# Patient Record
Sex: Male | Born: 1970 | Race: Black or African American | Hispanic: No | Smoking: Current every day smoker
Health system: Southern US, Community
[De-identification: ages and names within clinical notes are randomized; demographics above are authoritative.]

## PROBLEM LIST (undated history)

## (undated) HISTORY — PX: APPENDECTOMY: SHX54

---

## 1997-10-20 ENCOUNTER — Emergency Department (HOSPITAL_COMMUNITY): Admission: EM | Admit: 1997-10-20 | Discharge: 1997-10-20 | Payer: Self-pay | Admitting: Emergency Medicine

## 1997-10-22 ENCOUNTER — Emergency Department (HOSPITAL_COMMUNITY): Admission: EM | Admit: 1997-10-22 | Discharge: 1997-10-22 | Payer: Self-pay | Admitting: Emergency Medicine

## 1998-02-08 ENCOUNTER — Emergency Department (HOSPITAL_COMMUNITY): Admission: EM | Admit: 1998-02-08 | Discharge: 1998-02-08 | Payer: Self-pay | Admitting: Emergency Medicine

## 1998-05-23 ENCOUNTER — Emergency Department (HOSPITAL_COMMUNITY): Admission: EM | Admit: 1998-05-23 | Discharge: 1998-05-23 | Payer: Self-pay | Admitting: Emergency Medicine

## 2000-02-08 ENCOUNTER — Emergency Department (HOSPITAL_COMMUNITY): Admission: EM | Admit: 2000-02-08 | Discharge: 2000-02-08 | Payer: Self-pay | Admitting: Emergency Medicine

## 2000-08-10 ENCOUNTER — Emergency Department (HOSPITAL_COMMUNITY): Admission: EM | Admit: 2000-08-10 | Discharge: 2000-08-10 | Payer: Self-pay | Admitting: Emergency Medicine

## 2000-08-10 ENCOUNTER — Encounter: Payer: Self-pay | Admitting: Emergency Medicine

## 2001-01-01 ENCOUNTER — Emergency Department (HOSPITAL_COMMUNITY): Admission: EM | Admit: 2001-01-01 | Discharge: 2001-01-01 | Payer: Self-pay

## 2008-05-09 ENCOUNTER — Emergency Department (HOSPITAL_COMMUNITY): Admission: EM | Admit: 2008-05-09 | Discharge: 2008-05-09 | Payer: Self-pay | Admitting: Family Medicine

## 2008-11-15 ENCOUNTER — Emergency Department (HOSPITAL_COMMUNITY): Admission: EM | Admit: 2008-11-15 | Discharge: 2008-11-15 | Payer: Self-pay | Admitting: Family Medicine

## 2010-08-06 ENCOUNTER — Emergency Department (HOSPITAL_COMMUNITY)
Admission: EM | Admit: 2010-08-06 | Discharge: 2010-08-06 | Disposition: A | Discharge status: Home / Self Care | Payer: Self-pay | Attending: Emergency Medicine | Admitting: Emergency Medicine

## 2010-08-06 ENCOUNTER — Emergency Department (HOSPITAL_COMMUNITY): Payer: Self-pay

## 2010-08-06 DIAGNOSIS — R509 Fever, unspecified: Secondary | ICD-10-CM | POA: Insufficient documentation

## 2010-08-06 DIAGNOSIS — B9789 Other viral agents as the cause of diseases classified elsewhere: Secondary | ICD-10-CM | POA: Insufficient documentation

## 2010-08-06 DIAGNOSIS — R5381 Other malaise: Secondary | ICD-10-CM | POA: Insufficient documentation

## 2010-08-06 LAB — URINALYSIS, ROUTINE W REFLEX MICROSCOPIC
Bilirubin Urine: NEGATIVE
Ketones, ur: NEGATIVE mg/dL
Leukocytes, UA: NEGATIVE
Nitrite: NEGATIVE
Protein, ur: NEGATIVE mg/dL
pH: 7 (ref 5.0–8.0)

## 2010-10-02 ENCOUNTER — Emergency Department (HOSPITAL_COMMUNITY)
Admission: EM | Admit: 2010-10-02 | Discharge: 2010-10-02 | Disposition: A | Discharge status: Home / Self Care | Payer: Self-pay | Attending: Emergency Medicine | Admitting: Emergency Medicine

## 2010-10-02 DIAGNOSIS — R21 Rash and other nonspecific skin eruption: Secondary | ICD-10-CM | POA: Insufficient documentation

## 2010-10-02 DIAGNOSIS — L2989 Other pruritus: Secondary | ICD-10-CM | POA: Insufficient documentation

## 2010-10-02 DIAGNOSIS — L298 Other pruritus: Secondary | ICD-10-CM | POA: Insufficient documentation

## 2010-10-02 DIAGNOSIS — L509 Urticaria, unspecified: Secondary | ICD-10-CM | POA: Insufficient documentation

## 2011-10-17 ENCOUNTER — Encounter (HOSPITAL_COMMUNITY): Payer: Self-pay | Admitting: Emergency Medicine

## 2011-10-17 ENCOUNTER — Emergency Department (HOSPITAL_COMMUNITY)
Admission: EM | Admit: 2011-10-17 | Discharge: 2011-10-17 | Disposition: A | Discharge status: Home / Self Care | Payer: Self-pay | Attending: Emergency Medicine | Admitting: Emergency Medicine

## 2011-10-17 DIAGNOSIS — S61209A Unspecified open wound of unspecified finger without damage to nail, initial encounter: Secondary | ICD-10-CM | POA: Insufficient documentation

## 2011-10-17 DIAGNOSIS — Z23 Encounter for immunization: Secondary | ICD-10-CM | POA: Insufficient documentation

## 2011-10-17 DIAGNOSIS — W268XXA Contact with other sharp object(s), not elsewhere classified, initial encounter: Secondary | ICD-10-CM | POA: Insufficient documentation

## 2011-10-17 DIAGNOSIS — S61219A Laceration without foreign body of unspecified finger without damage to nail, initial encounter: Secondary | ICD-10-CM

## 2011-10-17 MED ORDER — OXYCODONE-ACETAMINOPHEN 5-325 MG PO TABS: 1.0000 | ORAL_TABLET | Freq: Four times a day (QID) | ORAL | Status: DC | PRN

## 2011-10-17 MED ORDER — OXYCODONE-ACETAMINOPHEN 5-325 MG PO TABS
2.0000 | ORAL_TABLET | Freq: Once | ORAL | Status: AC
  Administered 2011-10-17: 2 via ORAL
  Filled 2011-10-17: qty 2

## 2011-10-17 MED ORDER — TETANUS-DIPHTH-ACELL PERTUSSIS 5-2.5-18.5 LF-MCG/0.5 IM SUSP
0.5000 mL | Freq: Once | INTRAMUSCULAR | Status: AC
  Administered 2011-10-17: 0.5 mL via INTRAMUSCULAR
  Filled 2011-10-17: qty 0.5

## 2011-10-17 NOTE — ED Provider Notes (Addendum)
History  This chart was scribed for Hobart B. Bernette Mayers, MD by Shari Heritage. The patient was seen in room TR07C/TR07C. Patient's care was started at 1050.     CSN: [id number]  Arrival date & time 10/17/11  1022   First MD Initiated Contact with Patient 10/17/11 1050      Chief Complaint  Patient presents with  . Finger Injury    The history is provided by the patient. No language interpreter was used.    William Rojas is a 41 y.o. male who presents to the Emergency Department complaining of a laceration to his right index finger onset 1-2 hours ago. Patient states that he cut his finger on a piece of tin metal at work. He rates his current pain as 10/10. Patient is right handed. Patient did not take any medications PTA. Patient reports no other significant medical history. He is a current every day smoker. He drinks alcohol occasionally.  Patient's Tdap is not UTD.  No past medical history on file.  No past surgical history on file.  No family history on file.  History  Substance Use Topics  . Smoking status: Not on file  . Smokeless tobacco: Not on file  . Alcohol Use: Not on file      Review of Systems A complete 10 system review of systems was obtained and all systems are negative except as noted in the HPI and PMH.   Allergies  Review of patient's allergies indicates no known allergies.  Home Medications  No current outpatient prescriptions on file.  BP 127/94  Pulse 61  Temp 98.2 F (36.8 C) (Oral)  Resp 20  SpO2 98%  Physical Exam  Nursing note and vitals reviewed. Constitutional: He is oriented to person, place, and time. He appears well-developed and well-nourished.  HENT:  Head: Normocephalic and atraumatic.  Eyes: EOM are normal. Pupils are equal, round, and reactive to light.  Neck: Normal range of motion. Neck supple.  Cardiovascular: Normal rate, normal heart sounds and intact distal pulses.   Pulmonary/Chest: Effort normal and breath  sounds normal.  Abdominal: Bowel sounds are normal. He exhibits no distension. There is no tenderness.  Musculoskeletal: Normal range of motion. He exhibits no edema and no tenderness.  Neurological: He is alert and oriented to person, place, and time. He has normal strength. No cranial nerve deficit or sensory deficit.  Skin: Skin is warm and dry. Laceration noted.       2.5 cm laceration to the right index finger on the palmar side of the middle phalanx.  Psychiatric: He has a normal mood and affect.    ED Course  Procedures (including critical care time) DIAGNOSTIC STUDIES: Oxygen Saturation is 98% on room air, normal by my interpretation.    COORDINATION OF CARE: 10:54am- Patient informed of current plan for treatment and evaluation and agrees with plan at this time   Labs Reviewed - No data to display  No results found.   1. Finger laceration       MDM  Difficult to assess tendon function due to pain. Digital block performed by myself, prepped with betadine, used lidocaine 2% without epi. Laceration to be repaired by Trixie Dredge, PA. Tendon exam during that repair.       I personally performed the services described in the documentation, which were scribed in my presence. The recorded information has been reviewed and considered.      Jahree B. Bernette Mayers, MD 10/17/11 1329

## 2011-10-17 NOTE — ED Provider Notes (Signed)
Medical screening examination/treatment/procedure(s) were conducted as a shared visit with non-physician practitioner(s) and myself.  I personally evaluated the patient during the encounter   Hawken B. Bernette Mayers, MD 10/17/11 [phone]

## 2011-10-17 NOTE — ED Notes (Signed)
Pt to ED with right index finger lac, states he cut it on a piece of tin, bleeding controlled, no meds pta, NAD

## 2011-10-17 NOTE — ED Provider Notes (Signed)
Pt seen by Dr Bernette Mayers.  I was asked to repair lac of right index finger.    Pt reports digital block by Dr Bernette Mayers starting to wear off.  Pt is able to bend at each joint, doubt tendon injury.    LACERATION REPAIR Performed by: Trixie Dredge B Authorized by: Trixie Dredge B Consent: Verbal consent obtained. Risks and benefits: risks, benefits and alternatives were discussed Consent given by: patient Patient identity confirmed: provided demographic data Prepped and Draped in normal sterile fashion Wound explored  Laceration Location: right index finger  Laceration Length: 2.5 cm  No Foreign Bodies seen or palpated  Anesthesia: local infiltration  (pt previously digitally blocked by Dr Bernette Mayers)  Local anesthetic: lidocaine 2% no epinephrine  Anesthetic total: 3 ml  Irrigation method: syringe Amount of cleaning: standard  Skin closure: 5-0 ethilon  Number of sutures: 9  Technique: simple interrupted  Patient tolerance: Patient tolerated the procedure well with no immediate complications.  Pt advised to return in two weeks for recheck and suture removal.  Discussed return precautions with patient.  Pt verbalizes understanding and agrees with plan.     [place], [place] 10/17/11 1320

## 2012-01-21 IMAGING — CR DG CHEST 2V
2 series · 2 of 2 positions shown · non-contrast
Comparison: None.

CLINICAL DATA: Fever.  Shortness of breath.  Generalized weakness.
Smoker.

CHEST - 2 VIEW/7887:

[w chest pa]
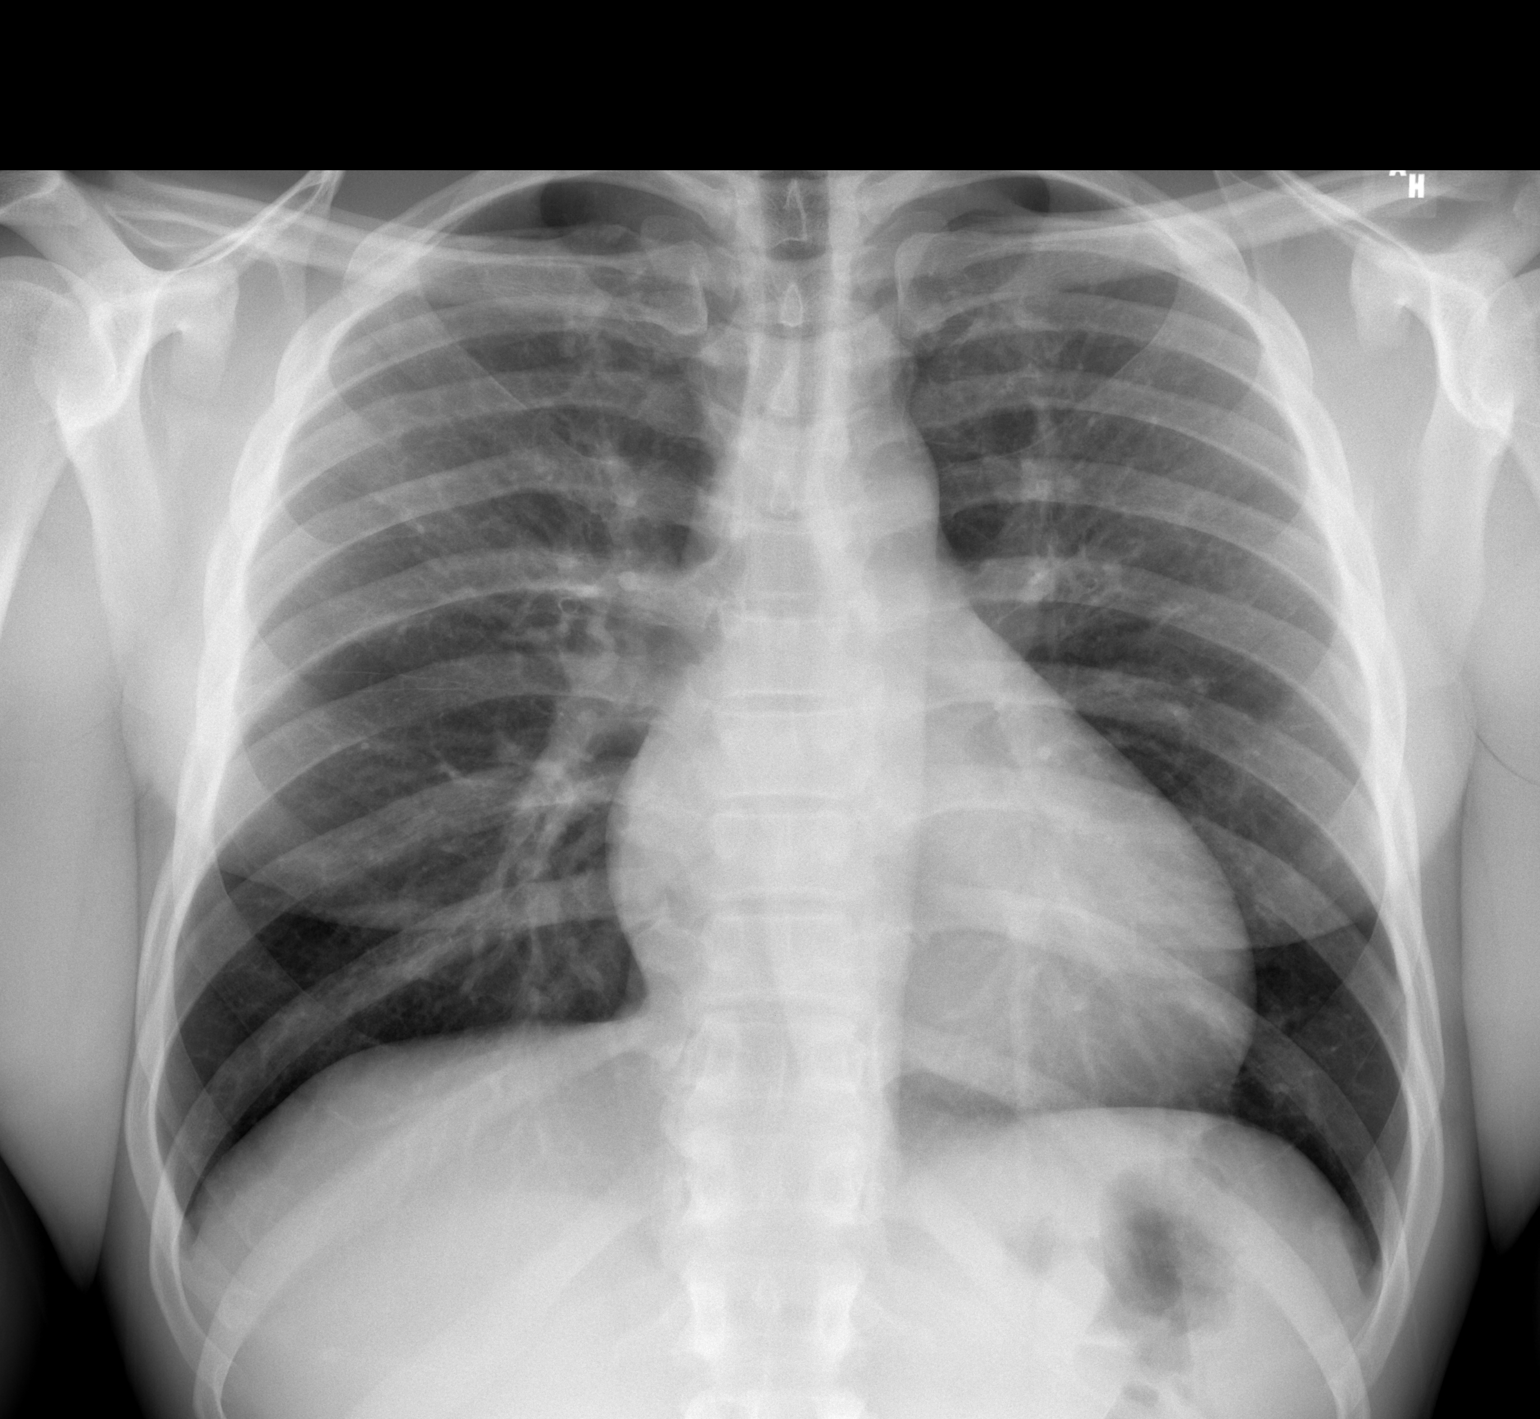

[w chest lat]
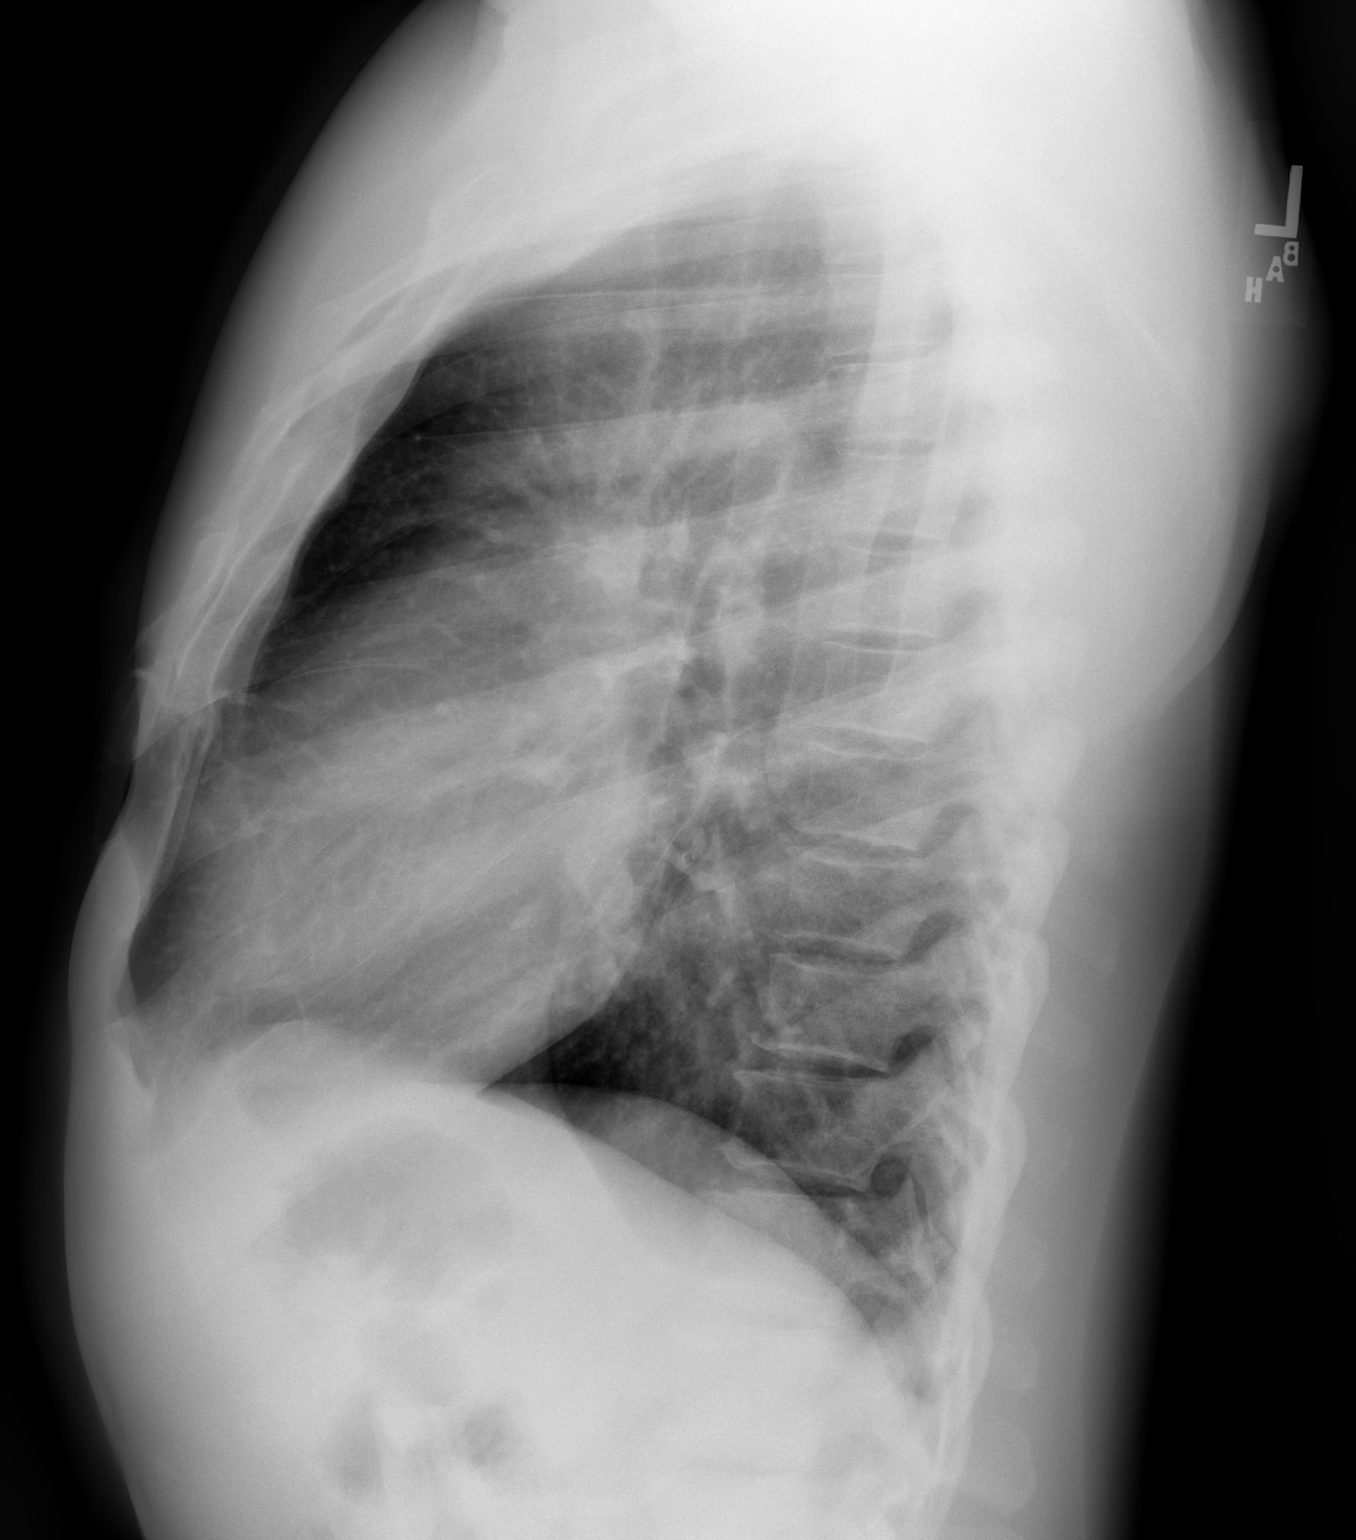

[2 of 2 positions shown; findings below may reference images not displayed]

FINDINGS: Cardiac silhouette upper normal in size to perhaps
slightly enlarged.  Hilar and mediastinal contours otherwise are.
Lungs clear.  Bronchovascular markings normal.  No pleural
effusions.  Mild degenerative changes involving the lower thoracic
spine.
IMPRESSION: Borderline heart size.  No acute cardiopulmonary disease.

## 2012-09-15 ENCOUNTER — Encounter (HOSPITAL_COMMUNITY): Payer: Self-pay | Admitting: Emergency Medicine

## 2012-09-15 ENCOUNTER — Emergency Department (HOSPITAL_COMMUNITY)
Admission: EM | Admit: 2012-09-15 | Discharge: 2012-09-15 | Disposition: A | Discharge status: Home / Self Care | Payer: Self-pay | Attending: Emergency Medicine | Admitting: Emergency Medicine

## 2012-09-15 DIAGNOSIS — F17200 Nicotine dependence, unspecified, uncomplicated: Secondary | ICD-10-CM | POA: Insufficient documentation

## 2012-09-15 DIAGNOSIS — L299 Pruritus, unspecified: Secondary | ICD-10-CM | POA: Insufficient documentation

## 2012-09-15 DIAGNOSIS — R21 Rash and other nonspecific skin eruption: Secondary | ICD-10-CM | POA: Insufficient documentation

## 2012-09-15 DIAGNOSIS — T7840XA Allergy, unspecified, initial encounter: Secondary | ICD-10-CM

## 2012-09-15 MED ORDER — FAMOTIDINE 20 MG PO TABS: 20.0000 mg | ORAL_TABLET | Freq: Two times a day (BID) | ORAL | Status: DC

## 2012-09-15 MED ORDER — FAMOTIDINE 20 MG PO TABS
20.0000 mg | ORAL_TABLET | Freq: Once | ORAL | Status: AC
  Administered 2012-09-15: 20 mg via ORAL
  Filled 2012-09-15: qty 1

## 2012-09-15 MED ORDER — PREDNISONE 20 MG PO TABS: ORAL_TABLET | ORAL | Status: DC

## 2012-09-15 MED ORDER — DIPHENHYDRAMINE HCL 25 MG PO CAPS
25.0000 mg | ORAL_CAPSULE | Freq: Once | ORAL | Status: AC
  Administered 2012-09-15: 25 mg via ORAL
  Filled 2012-09-15: qty 1

## 2012-09-15 MED ORDER — DEXAMETHASONE SODIUM PHOSPHATE 10 MG/ML IJ SOLN
10.0000 mg | Freq: Once | INTRAMUSCULAR | Status: AC
  Administered 2012-09-15: 10 mg via INTRAMUSCULAR
  Filled 2012-09-15: qty 1

## 2012-09-15 NOTE — ED Notes (Signed)
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 No itching was noted until this morning at 430 pt states he woke up itching with hives all over his body. Hives noted generalized and scattered.

## 2012-09-15 NOTE — ED Provider Notes (Signed)
  CSN: [id number]     Arrival date & time 09/15/12  0541 History     First MD Initiated Contact with Patient 09/15/12 0550     Chief Complaint  Patient presents with  . Pruritis   (Consider location/radiation/quality/duration/timing/severity/associated sxs/prior Treatment) Patient is a 42 y.o. male presenting with rash. The history is provided by the patient and the spouse.  Rash Location:  Shoulder/arm Shoulder/arm rash location:  L arm and R arm Quality: itchiness   Quality: not blistering, not draining and not painful   Severity:  Mild Onset quality:  Sudden Timing:  Constant Progression:  Unchanged Chronicity:  Recurrent Context: not animal contact, not chemical exposure, not exposure to similar rash, not food, not hot tub use, not plant contact and not pollen   Relieved by:  Nothing Worsened by:  Nothing tried Ineffective treatments:  None tried Associated symptoms: no periorbital edema, no sore throat, no throat swelling, no tongue swelling and not wheezing     History reviewed. No pertinent past medical history. History reviewed. No pertinent past surgical history. History reviewed. No pertinent family history. History  Substance Use Topics  . Smoking status: Current Every Day Smoker  . Smokeless tobacco: Not on file  . Alcohol Use: Yes    Review of Systems  HENT: Negative for sore throat.   Respiratory: Negative for wheezing.   Skin: Positive for rash.  All other systems reviewed and are negative.    Allergies  Review of patient's allergies indicates no known allergies.  Home Medications   Current Outpatient Rx  Name  Route  Sig  Dispense  Refill  . oxyCODONE-acetaminophen (PERCOCET/ROXICET) 5-325 MG per tablet   Oral   Take 1-2 tablets by mouth every 6 (six) hours as needed for pain.   20 tablet   0    BP 118/65  Pulse 56  Temp(Src) 98.1 F (36.7 C) (Oral)  Resp 18  SpO2 99% Physical Exam  Constitutional: He is oriented to person, place, and  time. He appears well-developed and well-nourished. No distress.  HENT:  Head: Normocephalic and atraumatic.  Mouth/Throat: Oropharynx is clear and moist.  No swelling of the lips tongue or uvula  Eyes: Conjunctivae are normal. Pupils are equal, round, and reactive to light.  Neck: Normal range of motion. Neck supple.  Cardiovascular: Normal rate, regular rhythm and intact distal pulses.   Pulmonary/Chest: Effort normal and breath sounds normal. No stridor. He has no wheezes. He has no rales.  Abdominal: Soft. Bowel sounds are normal. There is no tenderness. There is no rebound and no guarding.  Musculoskeletal: Normal range of motion.  Neurological: He is alert and oriented to person, place, and time.  Skin: Skin is dry. No rash noted.  Psychiatric: He has a normal mood and affect.    ED Course   Procedures (including critical care time)  Labs Reviewed - No data to display No results found. No diagnosis found.  MDM  Will treat with steroid taper.  Have advised benadryl every 6 hours for itching and pepcid ac twice daily for seven days.  Return for any worsening symptoms  Euline Kimbler K Faustina Gebert-Rasch, MD 09/15/12 [phone]

## 2013-03-20 ENCOUNTER — Encounter (HOSPITAL_COMMUNITY): Payer: Self-pay | Admitting: Emergency Medicine

## 2013-03-20 ENCOUNTER — Emergency Department (HOSPITAL_COMMUNITY)
Admission: EM | Admit: 2013-03-20 | Discharge: 2013-03-21 | Disposition: A | Discharge status: Home / Self Care | Payer: Self-pay | Attending: Emergency Medicine | Admitting: Emergency Medicine

## 2013-03-20 DIAGNOSIS — F17200 Nicotine dependence, unspecified, uncomplicated: Secondary | ICD-10-CM | POA: Insufficient documentation

## 2013-03-20 DIAGNOSIS — Y9289 Other specified places as the place of occurrence of the external cause: Secondary | ICD-10-CM | POA: Insufficient documentation

## 2013-03-20 DIAGNOSIS — S058X9A Other injuries of unspecified eye and orbit, initial encounter: Secondary | ICD-10-CM | POA: Insufficient documentation

## 2013-03-20 DIAGNOSIS — T1590XA Foreign body on external eye, part unspecified, unspecified eye, initial encounter: Secondary | ICD-10-CM | POA: Insufficient documentation

## 2013-03-20 DIAGNOSIS — S0500XA Injury of conjunctiva and corneal abrasion without foreign body, unspecified eye, initial encounter: Secondary | ICD-10-CM

## 2013-03-20 DIAGNOSIS — H109 Unspecified conjunctivitis: Secondary | ICD-10-CM | POA: Insufficient documentation

## 2013-03-20 DIAGNOSIS — Y939 Activity, unspecified: Secondary | ICD-10-CM | POA: Insufficient documentation

## 2013-03-20 DIAGNOSIS — T1592XA Foreign body on external eye, part unspecified, left eye, initial encounter: Secondary | ICD-10-CM

## 2013-03-20 DIAGNOSIS — Y990 Civilian activity done for income or pay: Secondary | ICD-10-CM | POA: Insufficient documentation

## 2013-03-20 MED ORDER — TETRACAINE HCL 0.5 % OP SOLN
1.0000 [drp] | Freq: Once | OPHTHALMIC | Status: AC
  Administered 2013-03-20: 1 [drp] via OPHTHALMIC
  Filled 2013-03-20: qty 2

## 2013-03-20 MED ORDER — FLUORESCEIN SODIUM 1 MG OP STRP
1.0000 | ORAL_STRIP | Freq: Once | OPHTHALMIC | Status: AC
  Administered 2013-03-20: 1 via OPHTHALMIC
  Filled 2013-03-20: qty 1

## 2013-03-20 NOTE — ED Notes (Signed)
Pt has pinpoint foreign body located underneath pupil that is not normally present; noticed by wife; reports feeling of foreign body in eye since yesterday. 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 Pt reports he has flushed it well since yesterday and is stinging. Green exudate noted in corner of eye.

## 2013-03-20 NOTE — ED Notes (Signed)
Pt c/o left side eye irritation. States he felt like he got some concrete in his eye. Exudate noted. States sometimes eye is blurry.

## 2013-03-20 NOTE — ED Provider Notes (Signed)
CSN: [id number]     Arrival date & time 03/20/13  2104 History  This chart was scribed for non-physician practitioner, Lowella DellGray Ailis Rigaud, PA-C working with Gerhard Munchobert Lockwood, MD by Luisa DagoPriscilla Tutu, ED scribe. This patient was seen in room TR04C/TR04C and the patient's care was started at 10:45 PM.    Chief Complaint  Patient presents with  . Foreign Body in Eye    The history is provided by the patient. No language interpreter was used.   HPI Comments: William Rojas is a 43 y.o. male who presents to the Emergency Department complaining of a foreign body in his left eye that occurred 1 day ago at his work. Pt states that it feels like he has concrete in his eye. 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 Pt states that it is not painful just irritating. Irritation to the eye is exacerbated when pt moves his eye.  He denies any visual disturbances, fever, chills, SOB, or cough.   History reviewed. No pertinent past medical history. Past Surgical History  Procedure Laterality Date  . Appendectomy     History reviewed. No pertinent family history. History  Substance Use Topics  . Smoking status: Current Every Day Smoker  . Smokeless tobacco: Former NeurosurgeonUser  . Alcohol Use: Yes    Review of Systems  Constitutional: Negative for fever and chills.  Eyes: Positive for discharge and redness. Negative for visual disturbance.  All other systems reviewed and are negative.      Allergies  Review of patient's allergies indicates no known allergies.  Home Medications   Current Outpatient Rx  Name  Route  Sig  Dispense  Refill  . erythromycin ophthalmic ointment   Left Eye   Place 1 application into the left eye every 6 (six) hours. Place 1/2 inch ribbon of ointment in the affected eye 4 times a day   1 g   1    BP 133/67  Pulse 69  Temp(Src) 98.1 F (36.7 C) (Oral)  Resp 16  Ht 5\' 10"  (1.778 m)  Wt 175 lb 6.4 oz (79.561 kg)  BMI 25.17 kg/m2  SpO2 98%  Physical  Exam  Nursing note and vitals reviewed. Constitutional: He is oriented to person, place, and time. He appears well-developed and well-nourished. No distress.  HENT:  Head: Normocephalic and atraumatic.  Eyes: EOM are normal. Pupils are equal, round, and reactive to light. Right eye exhibits no chemosis, no discharge, no exudate and no hordeolum. No foreign body present in the right eye. Left eye exhibits discharge. Left eye exhibits no chemosis, no exudate and no hordeolum. Foreign body present in the left eye. Right conjunctiva is injected. Right conjunctiva has no hemorrhage. Left conjunctiva is injected. Left conjunctiva has no hemorrhage. No scleral icterus. Right eye exhibits normal extraocular motion and no nystagmus. Left eye exhibits normal extraocular motion and no nystagmus.  Linear abrasions to inferior cornea of Left Eye,  Foreign body noted on inferior cornea of Left eye,     Neck: Normal range of motion. Neck supple. No JVD present. No tracheal deviation present.  Cardiovascular: Normal rate and regular rhythm.  Exam reveals no gallop and no friction rub.   No murmur heard. Pulmonary/Chest: Effort normal. No respiratory distress. He has no wheezes. He has no rhonchi. He has no rales.  Musculoskeletal: Normal range of motion. He exhibits no edema.  Lymphadenopathy:    He has no cervical adenopathy.  Neurological: He is alert and oriented  to person, place, and time.  Skin: Skin is warm and dry. He is not diaphoretic.  Psychiatric: He has a normal mood and affect. His behavior is normal.    ED Course  FOREIGN BODY REMOVAL Date/Time: 03/20/2013 10:10 PM Performed by: Rudene Anda Authorized by: Rudene Anda Consent: Verbal consent obtained. Risks and benefits: risks, benefits and alternatives were discussed Consent given by: patient Patient understanding: patient states understanding of the procedure being performed Patient identity confirmed: verbally with  patient and arm band Body area: eye Location details: left conjunctiva Anesthesia: local infiltration Local anesthetic: tetracaine drops Anesthetic total: 2 ml Patient sedated: no Localization method: eyelid eversion and visualized Removal mechanism: moist cotton swab (18 guage needle catheter) Eye examined with fluorescein. Fluorescein uptake. Corneal abrasion size: small Corneal abrasion location: inferior No residual rust ring present. Depth: superficial Complexity: simple 1 objects recovered. Objects recovered: concrete debri Post-procedure assessment: foreign body removed Patient tolerance: Patient tolerated the procedure well with no immediate complications. Comments: No additional foreign bodies found. Patient reports no foreign body sensation after removal.    (including critical care time)  DIAGNOSTIC STUDIES: Oxygen Saturation is 98% on RA, normal by my interpretation.    COORDINATION OF CARE:  10:53 PM- Pt advised of plan for treatment and pt agrees.  Medications  fluorescein ophthalmic strip 1 strip (1 strip Left Eye Given 03/20/13 2252)  tetracaine (PONTOCAINE) 0.5 % ophthalmic solution 1 drop (1 drop Left Eye Given 03/20/13 2252)   11:06 PM-Foreign body successfully removed. Will consult with attending physician about course of treatment. Pt advised of plan for treatment and pt agrees.   Labs Review Labs Reviewed - No data to display Imaging Review No results found.   EKG Interpretation  Date/Time:    Ventricular Rate:    PR Interval:    QRS Duration:   QT Interval:    QTC Calculation:   R Axis:     Text Interpretation:         MDM   Final diagnoses:  Foreign body of left eye  Corneal abrasion  Conjunctivitis   Patient afebrile.  VA is 20/20 on LEFT and 20/15 on Right  Plan to have patient follow up with Opthalmology in 24 hours for further evaluation. Patient started on Erythomycin opthalmologic ointment.  Patient agrees with plan.  Discharged in good condition. Return precautions given for eye pain or visual changes.   Meds given in ED:  Medications  fluorescein ophthalmic strip 1 strip (1 strip Left Eye Given 03/20/13 2252)  tetracaine (PONTOCAINE) 0.5 % ophthalmic solution 1 drop (1 drop Left Eye Given 03/20/13 2252)    Discharge Medication List as of 03/21/2013 12:08 AM    START taking these medications   Details  erythromycin ophthalmic ointment Place 1 application into the left eye every 6 (six) hours. Place 1/2 inch ribbon of ointment in the affected eye 4 times a day, Starting 03/21/2013, Until Discontinued, Print       I personally performed the services described in this documentation, which was scribed in my presence. The recorded information has been reviewed and is accurate.    Rudene Anda, PA-C 03/22/13 1433

## 2013-03-21 MED ORDER — ERYTHROMYCIN 5 MG/GM OP OINT: 1.0000 "application " | TOPICAL_OINTMENT | Freq: Four times a day (QID) | OPHTHALMIC | Status: DC

## 2013-03-21 MED ORDER — IBUPROFEN 600 MG PO TABS: 600.0000 mg | ORAL_TABLET | Freq: Four times a day (QID) | ORAL | Status: DC | PRN

## 2013-03-21 MED ORDER — SULFAMETHOXAZOLE-TRIMETHOPRIM 800-160 MG PO TABS: 1.0000 | ORAL_TABLET | Freq: Two times a day (BID) | ORAL | Status: DC

## 2013-03-21 NOTE — Discharge Instructions (Signed)
Make appointment with Opthalmology in 24 hours as listed above in "follow up" section. Use antibiotic ointment as directed. Return to the emergency department if you develop any visual changes, eye pain, fever/chills, or new worsening symptoms.     Corneal Abrasion The cornea is the clear covering at the front and center of the eye. When you look at the colored portion of the eye, you are looking through the cornea. It is a thin tissue made up of layers. The top layer is the most sensitive layer. A corneal abrasion happens if this layer is scratched or an injury causes it to come off.  HOME CARE  You may be given drops or a medicated cream. Use the medicine as told by your doctor.  A pressure patch may be put over the eye. If this is done, follow your doctor's instructions for when to remove the patch. Do not drive or use machines while the eye patch is on. Judging distances is hard to do with a patch on.  See your doctor for a follow-up exam if you are told to do so. It is very important that you keep this appointment. GET HELP IF:   You have pain, are sensitive to light, and have a scratchy feeling in one eye or both eyes.  Your pressure patch keeps getting loose. You can blink your eye under the patch.  You have fluid coming from your eye or the lids stick together in the morning.  You have the same symptoms in the morning that you did with the first abrasion. This could be days, weeks, or months after the first abrasion healed. MAKE SURE YOU:   Understand these instructions.  Will watch your condition.  Will get help right away if you are not doing well or get worse. Document Released: 06/29/2007 Document Revised: 10/31/2012 Document Reviewed: 09/17/2012 [facility] Patient Information 2014 [place], [place].   Conjunctivitis Conjunctivitis is commonly called "pink eye." Conjunctivitis can be caused by bacterial or viral infection, allergies, or injuries. There is usually redness of  the lining of the eye, itching, discomfort, and sometimes discharge. There may be deposits of matter along the eyelids. A viral infection usually causes a watery discharge, while a bacterial infection causes a yellowish, thick discharge. Pink eye is very contagious and spreads by direct contact. You may be given antibiotic eyedrops as part of your treatment. Before using your eye medicine, remove all drainage from the eye by washing gently with warm water and cotton balls. Continue to use the medication until you have awakened 2 mornings in a row without discharge from the eye. Do not rub your eye. This increases the irritation and helps spread infection. Use separate towels from other household members. Wash your hands with soap and water before and after touching your eyes. Use cold compresses to reduce pain and sunglasses to relieve irritation from light. Do not wear contact lenses or wear eye makeup until the infection is gone. SEEK MEDICAL CARE IF:   Your symptoms are not better after 3 days of treatment.  You have increased pain or trouble seeing.  The outer eyelids become very red or swollen. Document Released: 02/18/2004 Document Revised: 04/04/2011 Document Reviewed: 01/10/2005 [facility] Patient Information 2014 [place], [place].

## 2013-03-23 NOTE — ED Provider Notes (Signed)
Medical screening examination/treatment/procedure(s) were performed by non-physician practitioner and as supervising physician I was immediately available for consultation/collaboration.  Essa Malachi, MD 03/23/13 0702 

## 2013-09-20 ENCOUNTER — Encounter (HOSPITAL_COMMUNITY): Payer: Self-pay | Admitting: Emergency Medicine

## 2013-09-20 ENCOUNTER — Emergency Department (HOSPITAL_COMMUNITY)
Admission: EM | Admit: 2013-09-20 | Discharge: 2013-09-20 | Disposition: A | Discharge status: Home / Self Care | Payer: Self-pay | Attending: Emergency Medicine | Admitting: Emergency Medicine

## 2013-09-20 DIAGNOSIS — Z7982 Long term (current) use of aspirin: Secondary | ICD-10-CM | POA: Insufficient documentation

## 2013-09-20 DIAGNOSIS — F17200 Nicotine dependence, unspecified, uncomplicated: Secondary | ICD-10-CM | POA: Insufficient documentation

## 2013-09-20 DIAGNOSIS — Y9389 Activity, other specified: Secondary | ICD-10-CM | POA: Insufficient documentation

## 2013-09-20 DIAGNOSIS — Y92410 Unspecified street and highway as the place of occurrence of the external cause: Secondary | ICD-10-CM | POA: Insufficient documentation

## 2013-09-20 DIAGNOSIS — IMO0002 Reserved for concepts with insufficient information to code with codable children: Secondary | ICD-10-CM | POA: Insufficient documentation

## 2013-09-20 MED ORDER — IBUPROFEN 800 MG PO TABS: 800.0000 mg | ORAL_TABLET | Freq: Three times a day (TID) | ORAL | Status: AC

## 2013-09-20 MED ORDER — TRAMADOL HCL 50 MG PO TABS: 50.0000 mg | ORAL_TABLET | Freq: Four times a day (QID) | ORAL | Status: AC | PRN
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
## 2014-06-09 ENCOUNTER — Encounter (HOSPITAL_COMMUNITY): Payer: Self-pay | Admitting: Emergency Medicine

## 2014-06-09 ENCOUNTER — Emergency Department (HOSPITAL_COMMUNITY)
Admission: EM | Admit: 2014-06-09 | Discharge: 2014-06-09 | Disposition: A | Discharge status: Home / Self Care | Payer: Self-pay | Attending: Emergency Medicine | Admitting: Emergency Medicine

## 2014-06-09 DIAGNOSIS — L299 Pruritus, unspecified: Secondary | ICD-10-CM | POA: Insufficient documentation

## 2014-06-09 DIAGNOSIS — S60562A Insect bite (nonvenomous) of left hand, initial encounter: Secondary | ICD-10-CM | POA: Insufficient documentation

## 2014-06-09 DIAGNOSIS — Y929 Unspecified place or not applicable: Secondary | ICD-10-CM | POA: Insufficient documentation

## 2014-06-09 DIAGNOSIS — S30860A Insect bite (nonvenomous) of lower back and pelvis, initial encounter: Secondary | ICD-10-CM | POA: Insufficient documentation

## 2014-06-09 DIAGNOSIS — S60561A Insect bite (nonvenomous) of right hand, initial encounter: Secondary | ICD-10-CM | POA: Insufficient documentation

## 2014-06-09 DIAGNOSIS — S30861A Insect bite (nonvenomous) of abdominal wall, initial encounter: Secondary | ICD-10-CM | POA: Insufficient documentation

## 2014-06-09 DIAGNOSIS — S20369A Insect bite (nonvenomous) of unspecified front wall of thorax, initial encounter: Secondary | ICD-10-CM | POA: Insufficient documentation

## 2014-06-09 DIAGNOSIS — W57XXXA Bitten or stung by nonvenomous insect and other nonvenomous arthropods, initial encounter: Secondary | ICD-10-CM | POA: Insufficient documentation

## 2014-06-09 DIAGNOSIS — Y939 Activity, unspecified: Secondary | ICD-10-CM | POA: Insufficient documentation

## 2014-06-09 DIAGNOSIS — Y999 Unspecified external cause status: Secondary | ICD-10-CM | POA: Insufficient documentation

## 2014-06-09 DIAGNOSIS — Z720 Tobacco use: Secondary | ICD-10-CM | POA: Insufficient documentation

## 2014-06-09 MED ORDER — DOXYCYCLINE HYCLATE 100 MG PO CAPS: 100.0000 mg | ORAL_CAPSULE | Freq: Two times a day (BID) | ORAL | Status: AC

## 2014-06-09 MED ORDER — HYDROXYZINE HCL 10 MG PO TABS: 10.0000 mg | ORAL_TABLET | Freq: Four times a day (QID) | ORAL | Status: AC | PRN

## 2014-06-09 NOTE — ED Notes (Signed)
Pt. reports generalized body itching onset last week unrelieved by OTC Benadryl.

## 2014-06-09 NOTE — Discharge Instructions (Signed)
Take the prescribed medication as directed.  Use caution with atarax, it can make you a little bit drowsy. Follow-up with your primary care physician. Return to the ED for new or worsening symptoms.

## 2014-06-09 NOTE — ED Provider Notes (Signed)
CSN: [id number]     Arrival date & time 06/09/14  2057 History  This chart was scribed for non-physician provider Sharilyn SitesLisa Sanders, PA-C, working with Mancel BaleElliott Wentz, MD by Phillis HaggisGabriella Gaje, ED Scribe. This patient was seen in room TR05C/TR05C and patient care was started at 9:55 PM.     Chief Complaint  Patient presents with  . Pruritis   The history is provided by the patient. No language interpreter was used.    HPI Comments: Charlena CrossCharles J Niehoff is a 44 y.o. male who presents to the Emergency Department complaining of generalized body itching onset one week ago. He states that he has been bitten by several ticks over the past week, notably on his legs and back. He reports itching to his arms, stomach, legs and back; reports itching is worst on his back. He denies using any new soaps, detergents, or other personal care products. He denies nausea, vomiting, diarrhea, abdominal pain, fever, SOB, cough or trouble swallowing. He denies any liver issues. No home with similar symptoms.  Has taken benadryl PTA without relief.  History reviewed. No pertinent past medical history. Past Surgical History  Procedure Laterality Date  . Appendectomy     No family history on file. History  Substance Use Topics  . Smoking status: Current Every Day Smoker  . Smokeless tobacco: Former NeurosurgeonUser  . Alcohol Use: Yes    Review of Systems  Constitutional: Negative for fever.  HENT: Negative for trouble swallowing.   Respiratory: Negative for cough and shortness of breath.   Gastrointestinal: Negative for nausea, vomiting, abdominal pain and diarrhea.  Skin:       itching  All other systems reviewed and are negative.  Allergies  Review of patient's allergies indicates no known allergies.  Home Medications   Prior to Admission medications   Medication Sig Start Date End Date Taking? Authorizing Provider  Aspirin-Salicylamide-Caffeine (BC HEADACHE POWDER PO) Take 1 Package by mouth every 6 (six) hours as needed  (for headache / pain).    Historical Provider, MD  traMADol (ULTRAM) 50 MG tablet Take 1 tablet (50 mg total) by mouth every 6 (six) hours as needed. 09/20/13   Gerhard Munchobert Lockwood, MD   BP 122/67 mmHg  Pulse 66  Temp(Src) 98.3 F (36.8 C) (Oral)  Resp 18  SpO2 100%  Physical Exam  Constitutional: He is oriented to person, place, and time. He appears well-developed and well-nourished.  HENT:  Head: Normocephalic and atraumatic.  Mouth/Throat: Oropharynx is clear and moist.  Eyes: Conjunctivae and EOM are normal. Pupils are equal, round, and reactive to light. No scleral icterus.  No scleral icterus  Neck: Normal range of motion.  Cardiovascular: Normal rate, regular rhythm and normal heart sounds.   Pulmonary/Chest: Effort normal and breath sounds normal. No respiratory distress. He has no wheezes.  Abdominal: Soft. Bowel sounds are normal.  Musculoskeletal: Normal range of motion.  Neurological: He is alert and oriented to person, place, and time.  Skin: Skin is warm and dry.  Multiple areas of excoriation to chest, abdomen, back, and bilateral upper extremities; there are no noted tick bites, open lesions, or cellulitis changes of the skin; no jaundice; no lesions on palms/soles  Psychiatric: He has a normal mood and affect.  Nursing note and vitals reviewed.   ED Course  Procedures (including critical care time) DIAGNOSTIC STUDIES: Oxygen Saturation is 100% on room air, normal by my interpretation.    COORDINATION OF CARE: 9:57 PM-Discussed treatment plan which includes atarax and doxycycline  with pt at bedside and pt agreed to plan.   Labs Review Labs Reviewed - No data to display  Imaging Review No results found.   EKG Interpretation None      MDM   Final diagnoses:  Tick bite  Itching   44 year old male here with generalized itching. He reports several tick bites over the past week, mostly on his back and lower extremities. Patient is afebrile and nontoxic in  appearance. His skin does not appear jaundiced and he does not have any scleral icterus. He has no baseline hepatic issues, lower suspicion for acute liver failure. There are multiple areas of excoriation to his chest, abdomen, back and upper extremities without noted blood bites or open lesions. There is no signs of cellulitis. Patient will be started on doxycycline for prophylaxis against [facility] fever and Lyme disease although he has no rash at this time. Also given atarax for itching.  He will follow-up with his PCP.  Discussed plan with patient, he/she acknowledged understanding and agreed with plan of care.  Return precautions given for new or worsening symptoms.  I personally performed the services described in this documentation, which was scribed in my presence. The recorded information has been reviewed and is accurate.  Garlon HatchetLisa M Sanders, PA-C 06/09/14 2224  Mancel BaleElliott Wentz, MD 06/11/14 [phone]

## 2022-01-09 ENCOUNTER — Emergency Department (HOSPITAL_COMMUNITY): Payer: Self-pay

## 2022-01-09 ENCOUNTER — Other Ambulatory Visit: Payer: Self-pay

## 2022-01-09 ENCOUNTER — Encounter (HOSPITAL_COMMUNITY): Payer: Self-pay | Admitting: Emergency Medicine

## 2022-01-09 ENCOUNTER — Emergency Department (HOSPITAL_COMMUNITY)
Admission: EM | Admit: 2022-01-09 | Discharge: 2022-01-09 | Disposition: A | Discharge status: Home / Self Care | Payer: Self-pay | Attending: Emergency Medicine | Admitting: Emergency Medicine

## 2022-01-09 DIAGNOSIS — N503 Cyst of epididymis: Secondary | ICD-10-CM

## 2022-01-09 DIAGNOSIS — Z7982 Long term (current) use of aspirin: Secondary | ICD-10-CM | POA: Insufficient documentation

## 2022-01-09 DIAGNOSIS — L720 Epidermal cyst: Secondary | ICD-10-CM | POA: Insufficient documentation

## 2022-01-09 LAB — URINALYSIS, ROUTINE W REFLEX MICROSCOPIC
Bilirubin Urine: NEGATIVE
Glucose, UA: NEGATIVE mg/dL
Hgb urine dipstick: NEGATIVE
Ketones, ur: NEGATIVE mg/dL
Nitrite: NEGATIVE
Protein, ur: NEGATIVE mg/dL
Specific Gravity, Urine: 1.006 (ref 1.005–1.030)
pH: 5 (ref 5.0–8.0)

## 2022-01-09 NOTE — ED Triage Notes (Signed)
Patient here w/ growth to the R testicle area that has become progressively larger over the last 6 months when her first noticed it. States it has progressed from pea size to lime/ lemon size. Denies any urinary issues. Denies drainage from the area.  Denies fevers/ chills.

## 2022-01-09 NOTE — Discharge Instructions (Addendum)
Your ultrasound shows that you have a cyst, which is a benign growth, on top of your right testicle.  I recommend you follow-up with a urologist or a specialist for this, to prevent future problems.  Please call their office on Monday to schedule follow-up appointment.

## 2022-01-09 NOTE — ED Provider Triage Note (Signed)
Emergency Medicine Provider Triage Evaluation Note  William Rojas , a 51 y.o. male  was evaluated in triage.  Pt complains of right painless testicular mass.  Patient noted mass approximately 6 months ago that has progressively increased in size.  Denies dysuria, hematuria, penile discharge.  Review of Systems  Positive: See above Negative:   Physical Exam  BP (!) 132/99   Pulse 76   Temp 98.3 F (36.8 C)   Resp 18   SpO2 100%  Gen:   Awake, no distress   Resp:  Normal effort  MSK:   Moves extremities without difficulty  Other:  Painless right testicular mass  Medical Decision Making  Medically screening exam initiated at 8:53 AM.  Appropriate orders placed.  Charlena Cross was informed that the remainder of the evaluation will be completed by another provider, this initial triage assessment does not replace that evaluation, and the importance of remaining in the ED until their evaluation is complete.     Peter Garter, [place] 01/09/22 [phone]

## 2022-01-09 NOTE — ED Provider Notes (Signed)
[facility] EMERGENCY DEPARTMENT Provider Note   CSN: [id number] Arrival date & time: 01/09/22  2706     History  Chief Complaint  Patient presents with   Groin Swelling    William Rojas is a 51 y.o. male presenting with a painless swelling on his right testicle that is noted for 6 months.  No difficulty with urination.  HPI     Home Medications Prior to Admission medications   Medication Sig Start Date End Date Taking? Authorizing Provider  Aspirin-Salicylamide-Caffeine (BC HEADACHE POWDER PO) Take 1 Package by mouth every 6 (six) hours as needed (for headache / pain).    [provider]  doxycycline (VIBRAMYCIN) 100 MG capsule Take 1 capsule (100 mg total) by mouth 2 (two) times daily. 06/09/14   Garlon Hatchet, PA-C  hydrOXYzine (ATARAX/VISTARIL) 10 MG tablet Take 1 tablet (10 mg total) by mouth every 6 (six) hours as needed for itching. 06/09/14   Garlon Hatchet, PA-C  traMADol (ULTRAM) 50 MG tablet Take 1 tablet (50 mg total) by mouth every 6 (six) hours as needed. 09/20/13   Gerhard Munch, MD      Allergies    Patient has no known allergies.    Review of Systems   Review of Systems  Physical Exam Updated Vital Signs BP 133/79   Pulse 76   Temp 98.3 F (36.8 C)   Resp 18   SpO2 100%  Physical Exam Constitutional:      General: He is not in acute distress. HENT:     Head: Normocephalic and atraumatic.  Eyes:     Conjunctiva/sclera: Conjunctivae normal.     Pupils: Pupils are equal, round, and reactive to light.  Cardiovascular:     Rate and Rhythm: Normal rate and regular rhythm.  Pulmonary:     Effort: Pulmonary effort is normal. No respiratory distress.  Genitourinary:    Comments: GU exam is notable for soft nontender growth above the right testicle near the epididymis, without testicular tenderness or scrotal swelling Skin:    General: Skin is warm and dry.  Neurological:     General: No focal deficit present.      Mental Status: He is alert. Mental status is at baseline.  Psychiatric:        Mood and Affect: Mood normal.        Behavior: Behavior normal.     ED Results / Procedures / Treatments   Labs (all labs ordered are listed, but only abnormal results are displayed) Labs Reviewed  URINALYSIS, ROUTINE W REFLEX MICROSCOPIC - Abnormal; Notable for the following components:      Result Value   Color, Urine STRAW (*)    Leukocytes,Ua TRACE (*)    Bacteria, UA RARE (*)    All other components within normal limits  GC/CHLAMYDIA PROBE AMP ([place]) NOT AT [employer] [facility]    EKG None  Radiology US SCROTUM W/DOPPLER  Result Date: 01/09/2022 CLINICAL DATA:  Right scrotal mass EXAM: ULTRASOUND OF SCROTUM TECHNIQUE: Complete ultrasound examination of the testicles, epididymis, and other scrotal structures was performed. COMPARISON:  None Available. FINDINGS: The right testis measured 4.0 x 2.9 x 2.3 cm, and the left testis measured 4.2 x 2.5 x 2.0 cm. The testes demonstrated blood flow with Doppler. There is a right-sided epididymal cyst measuring 4.9 cm. Left epididymis unremarkable. There is evidence of bilateral varicocele, 3.2 cm on the left and 2.4 cm on the right. IMPRESSION: 1. Large right-sided epididymal cyst. 2.  Bilateral varicocele. 3. No testicular abnormalities. Electronically Signed   By: Layla Maw M.D.   On: 01/09/2022 09:36    Procedures Procedures    Medications Ordered in ED Medications - No data to display  ED Course/ Medical Decision Making/ A&P                           Medical Decision Making  Differential would include varicocele versus epididymal cyst versus epididymitis versus orchitis versus other  Scrotal ultrasound personally reviewed interpreted showing a large epididymal cyst, which correlates with a growth present on exam.  This is likely a benign condition.  However given the size of the cyst, the possibility of compression of the vascular structures in the  future, I do think it is reasonable to refer him to urology.  The patient and his wife, who was present, verbalized understanding.  Urinalysis shows no evidence of infection.  I do not see an indication for antibiotics at this time.        Final Clinical Impression(s) / ED Diagnoses Final diagnoses:  Cyst, epididymis    Rx / DC Orders ED Discharge Orders     None         Terald Sleeper, MD 01/09/22 1414

## 2022-01-10 LAB — GC/CHLAMYDIA PROBE AMP (~~LOC~~) NOT AT ARMC
Chlamydia: NEGATIVE
Comment: NEGATIVE
Comment: NORMAL
Neisseria Gonorrhea: NEGATIVE
